# Patient Record
Sex: Male | Born: 1975 | Race: White | Hispanic: No | Marital: Married | Smoking: Former smoker
Health system: Southern US, Community
[De-identification: ages and names within clinical notes are randomized; demographics above are authoritative.]

## PROBLEM LIST (undated history)

## (undated) DIAGNOSIS — G4733 Obstructive sleep apnea (adult) (pediatric): Secondary | ICD-10-CM

## (undated) DIAGNOSIS — Z9989 Dependence on other enabling machines and devices: Secondary | ICD-10-CM

## (undated) DIAGNOSIS — E785 Hyperlipidemia, unspecified: Secondary | ICD-10-CM

## (undated) DIAGNOSIS — T8859XA Other complications of anesthesia, initial encounter: Secondary | ICD-10-CM

## (undated) DIAGNOSIS — T4145XA Adverse effect of unspecified anesthetic, initial encounter: Secondary | ICD-10-CM

## (undated) DIAGNOSIS — I10 Essential (primary) hypertension: Secondary | ICD-10-CM

## (undated) HISTORY — PX: HERNIA REPAIR: SHX51

## (undated) HISTORY — PX: LIPOMA EXCISION: SHX5283

## (undated) HISTORY — DX: Essential (primary) hypertension: I10

## (undated) HISTORY — PX: APPENDECTOMY: SHX54

## (undated) HISTORY — PX: ANTERIOR CRUCIATE LIGAMENT REPAIR: SHX115

## (undated) HISTORY — DX: Obstructive sleep apnea (adult) (pediatric): G47.33

## (undated) HISTORY — DX: Obstructive sleep apnea (adult) (pediatric): Z99.89

## (undated) HISTORY — DX: Hyperlipidemia, unspecified: E78.5

---

## 1998-07-09 ENCOUNTER — Encounter: Payer: Self-pay | Admitting: Emergency Medicine

## 1998-07-09 ENCOUNTER — Emergency Department (HOSPITAL_COMMUNITY): Admission: EM | Admit: 1998-07-09 | Discharge: 1998-07-09 | Payer: Self-pay | Admitting: Emergency Medicine

## 1998-07-14 ENCOUNTER — Ambulatory Visit (HOSPITAL_COMMUNITY): Admission: RE | Admit: 1998-07-14 | Discharge: 1998-07-14 | Payer: Self-pay | Admitting: Orthopedic Surgery

## 2000-04-29 ENCOUNTER — Ambulatory Visit (HOSPITAL_COMMUNITY): Admission: RE | Admit: 2000-04-29 | Discharge: 2000-04-29 | Payer: Self-pay | Admitting: Specialist

## 2000-04-29 ENCOUNTER — Encounter: Payer: Self-pay | Admitting: Specialist

## 2000-07-03 ENCOUNTER — Encounter: Payer: Self-pay | Admitting: Specialist

## 2000-07-03 ENCOUNTER — Ambulatory Visit (HOSPITAL_COMMUNITY): Admission: RE | Admit: 2000-07-03 | Discharge: 2000-07-03 | Payer: Self-pay | Admitting: Specialist

## 2002-12-29 ENCOUNTER — Encounter: Admission: RE | Admit: 2002-12-29 | Discharge: 2002-12-29 | Payer: Self-pay | Admitting: Geriatric Medicine

## 2002-12-29 ENCOUNTER — Encounter: Payer: Self-pay | Admitting: Geriatric Medicine

## 2003-03-08 ENCOUNTER — Emergency Department (HOSPITAL_COMMUNITY): Admission: EM | Admit: 2003-03-08 | Discharge: 2003-03-09 | Payer: Self-pay | Admitting: Emergency Medicine

## 2003-03-09 ENCOUNTER — Encounter: Payer: Self-pay | Admitting: Internal Medicine

## 2003-03-09 ENCOUNTER — Encounter: Admission: RE | Admit: 2003-03-09 | Discharge: 2003-03-09 | Payer: Self-pay | Admitting: Internal Medicine

## 2004-05-24 ENCOUNTER — Ambulatory Visit (HOSPITAL_BASED_OUTPATIENT_CLINIC_OR_DEPARTMENT_OTHER): Admission: RE | Admit: 2004-05-24 | Discharge: 2004-05-24 | Payer: Self-pay | Admitting: Surgery

## 2009-11-29 ENCOUNTER — Encounter: Admission: RE | Admit: 2009-11-29 | Discharge: 2009-11-29 | Payer: Self-pay | Admitting: Internal Medicine

## 2010-05-22 ENCOUNTER — Encounter: Admission: RE | Admit: 2010-05-22 | Discharge: 2010-05-22 | Payer: Self-pay | Admitting: Internal Medicine

## 2010-05-22 ENCOUNTER — Ambulatory Visit (HOSPITAL_COMMUNITY): Admission: RE | Admit: 2010-05-22 | Discharge: 2010-05-23 | Payer: Self-pay | Admitting: Surgery

## 2010-05-22 ENCOUNTER — Encounter (INDEPENDENT_AMBULATORY_CARE_PROVIDER_SITE_OTHER): Payer: Self-pay | Admitting: Surgery

## 2010-11-08 ENCOUNTER — Ambulatory Visit (HOSPITAL_COMMUNITY)
Admission: RE | Admit: 2010-11-08 | Discharge: 2010-11-08 | Disposition: A | Discharge status: Home / Self Care | Payer: BC Managed Care – PPO | Source: Ambulatory Visit | Attending: Gastroenterology | Admitting: Gastroenterology

## 2010-11-08 DIAGNOSIS — Z9089 Acquired absence of other organs: Secondary | ICD-10-CM | POA: Insufficient documentation

## 2010-11-08 DIAGNOSIS — R0681 Apnea, not elsewhere classified: Secondary | ICD-10-CM | POA: Insufficient documentation

## 2010-11-08 DIAGNOSIS — K5730 Diverticulosis of large intestine without perforation or abscess without bleeding: Secondary | ICD-10-CM | POA: Insufficient documentation

## 2010-11-08 DIAGNOSIS — Z800 Family history of malignant neoplasm of digestive organs: Secondary | ICD-10-CM | POA: Insufficient documentation

## 2010-11-08 DIAGNOSIS — K589 Irritable bowel syndrome without diarrhea: Secondary | ICD-10-CM | POA: Insufficient documentation

## 2010-11-20 NOTE — Op Note (Signed)
  Norman Hansen             ACCOUNT NO.:  [id number]  MEDICAL RECORD NO.:  [id number]           PATIENT TYPE:  O  LOCATION:  [facility]                         FACILITY:  [facility]  PHYSICIAN:  Norman Hansen, M.D.   DATE OF BIRTH:  1976-05-14  DATE OF PROCEDURE:  11/08/2010 DATE OF DISCHARGE:                              OPERATIVE REPORT   PROCEDURE:  Colonoscopy.  REFERRING PHYSICIAN:  Thora Lance, M.D.  HISTORY:  [name] is a 36 year old male born on 07-01-1976.  In March 2011, the patient was diagnosed with descending colonic diverticulitis.  In August 2011, he underwent an appendectomy to treat acute appendicitis.  The patient is scheduled to undergo a diagnostic colonoscopy following his bout of acute diverticulitis.  He has no symptoms to suggest inflammatory bowel disease.  His uncle was diagnosed with colon cancer.  He has no first-degree relatives with colon cancer.  ENDOSCOPIST:  Norman Hansen, M.D.  PREMEDICATIONS: 1. Fentanyl 100 mcg. 2. Versed 5 mg.  DESCRIPTION OF PROCEDURE:  After obtaining informed consent, the patient was placed in the left lateral decubitus position.  I administered intravenous fentanyl and intravenous Versed to achieve conscious sedation for the procedure.  The patient's blood pressure, oxygen saturation and cardiac rhythm were monitored throughout the procedure and documented in the medical record.  Anal inspection and digital rectal exam were normal.  The Pentax pediatric colonoscope was introduced into the rectum and easily advanced to the cecum.  A normal-appearing ileocecal valve and appendiceal orifice were identified.  Colonic preparation for the exam today was good.  Rectum normal.  Retroflex view of the distal rectum normal.  Sigmoid colon and descending colon.  Scattered small diverticula without signs of diverticulitis, diverticular stricture formation, or inflammatory bowel disease.  Splenic  flexure normal.  Transverse colon.  A small isolated diverticulum was present in the mid transverse colon.  Hepatic flexure normal.  Descending colon.  A few small diverticula are noted in the ascending colon.  Cecum and ileocecal valve normal.  ASSESSMENT: 1. Colonic diverticulosis without signs of diverticulitis,     diverticular stricture formation, inflammatory bowel disease, or     colorectal neoplasia. 2. Normal proctocolonoscopy to the cecum.  RECOMMENDATIONS:  Schedule screening colonoscopy at age 25.          ______________________________ Norman Hansen, M.D.     MJ/MEDQ  D:  11/08/2010  T:  11/08/2010  Job:  [id number]  cc:   Thora Lance, M.D. Fax: [phone]  Electronically Signed by Norman Hansen M.D. on 11/20/2010 02:37:52 PM

## 2011-02-22 NOTE — Op Note (Signed)
NAME:  Norman Hansen, Norman Hansen                       ACCOUNT NO.:  [id number]   MEDICAL RECORD NO.:  [id number]                   PATIENT TYPE:  AMB   LOCATION:  [facility]                                  FACILITY:  [facility]   PHYSICIAN:  Abigail Miyamoto, M.D.              DATE OF BIRTH:  [birth year]   DATE OF PROCEDURE:  05/24/2004  DATE OF DISCHARGE:                                 OPERATIVE REPORT   PREOPERATIVE DIAGNOSIS:  Umbilical hernia.   POSTOPERATIVE DIAGNOSIS:  Umbilical hernia.   PROCEDURE:  Umbilical hernia repair.   SURGEON:  Dr. Riley Lam A. Magnus Ivan.   ANESTHESIA:  1. General.  2. Marcaine 0.5% with epinephrine.   ESTIMATED BLOOD LOSS:  Minimal.   PROCEDURE IN DETAIL:  The patient was brought to the operating room,  identified as Eulis Manly.  He was placed supine on the operating room  table and general anesthesia was induced.  His abdomen was then prepped and  draped in the usual sterile fashion.  The skin around the umbilicus was then  anesthetized with 0.25% Marcaine.  A small transverse incision was made just  below the umbilicus.  The incision was carried down to the hernia sac which  was easily identified and separated from the umbilical skin.  The contents  of the sac were then reduced back into the peritoneal cavity.  The fascial  defect was then closed with three separate #1 Ethibond sutures in a figure-  of-eight fashion.  Excellent reapproximation of the fascia appeared to be  achieved and the actual fascial defect was less than 1 cm in size.  At this  point, the fascia and skin were further anesthetized with more of the  Marcaine.  The subcutaneous layer was then closed with interrupted 3-0  Vicryl sutures, the skin was closed with running 4-0 Monocryl.  Steri-  Strips, gauze and tape were then applied.  The patient tolerated the  procedure well.  All sponge, needle and instrument counts were correct at  the end of the procedure.  The patient was then extubated  in the operating  room and taken in stable condition to the recovery room.                                               Abigail Miyamoto, M.D.    DB/MEDQ  D:  05/24/2004  T:  05/24/2004  Job:  [id number]

## 2011-02-27 IMAGING — CT CT ABD-PELV W/O CM
2 of 4 series · 14 of 32 positions shown, 19 images · non-contrast
Comparison: 03/09/2003 (with contrast)

CLINICAL DATA: Left flank pain.  History of diverticulitis

CT ABDOMEN AND PELVIS WITHOUT CONTRAST
TECHNIQUE: Multidetector CT imaging of the abdomen and pelvis was
performed following the standard protocol without intravenous
contrast.

[Series 2: renal stone w/o · axial · non-contrast · 0.76mm/px · z∈[-312,+23]mm · 6 of 95 slices shown, 11 images]
[im 14/95  soft-tissue]
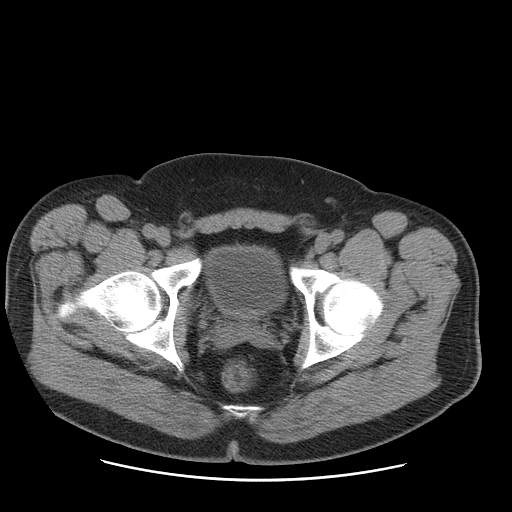
[im 14/95  bone]
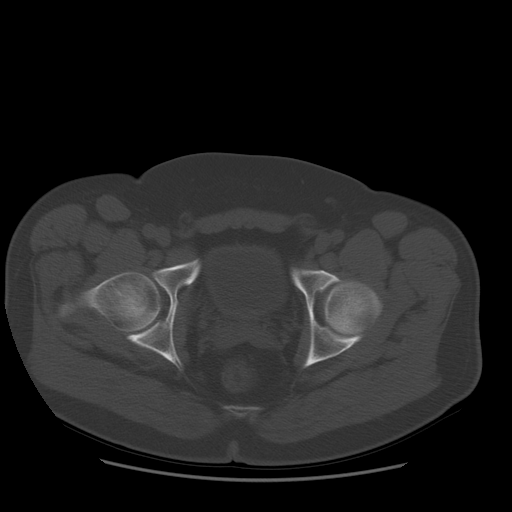
[im 27/95  soft-tissue]
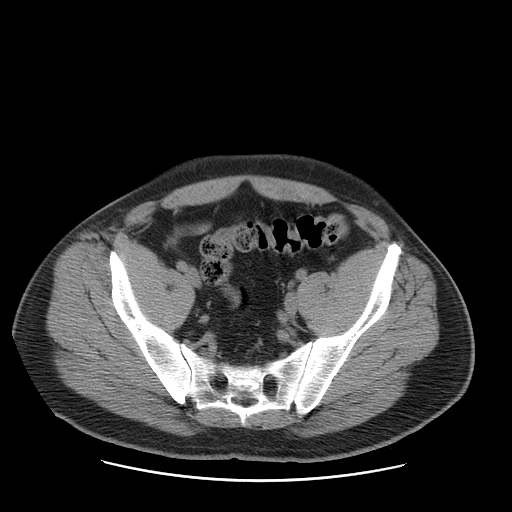
[im 41/95  soft-tissue]
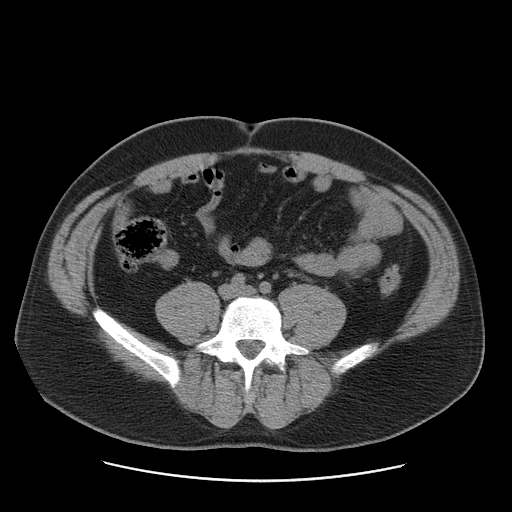
[im 41/95  lung]
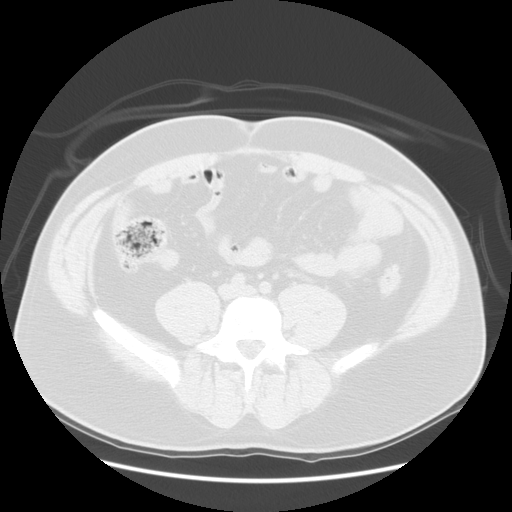
[im 54/95  soft-tissue]
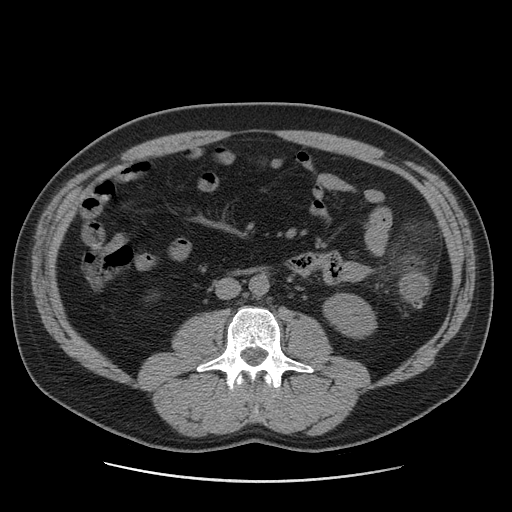
[im 54/95  lung]
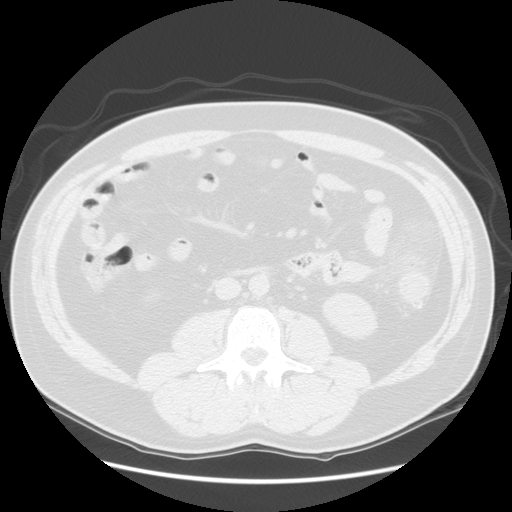
[im 68/95  soft-tissue]
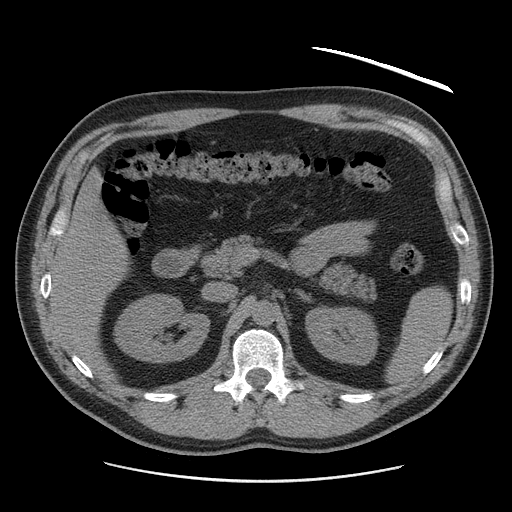
[im 68/95  lung]
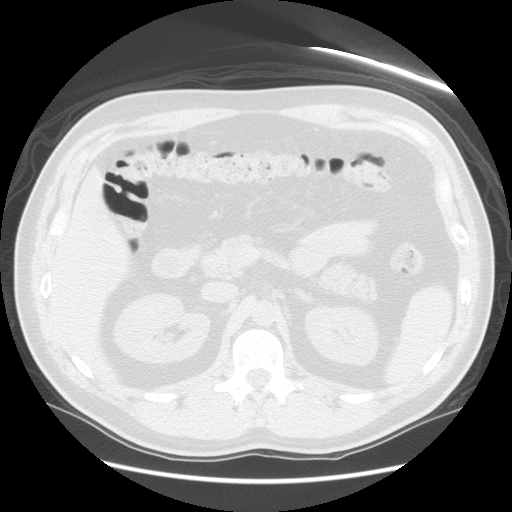
[im 81/95  soft-tissue]
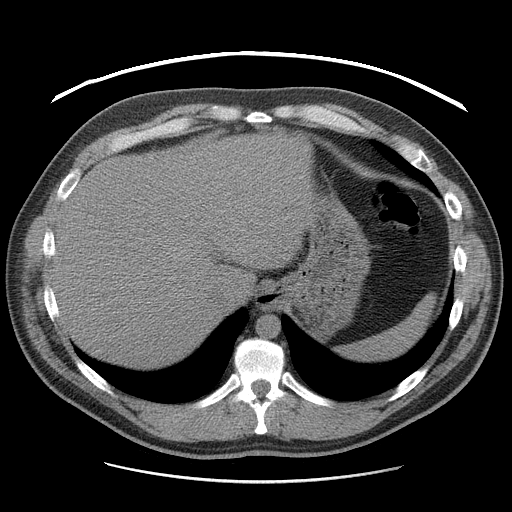
[im 81/95  lung]
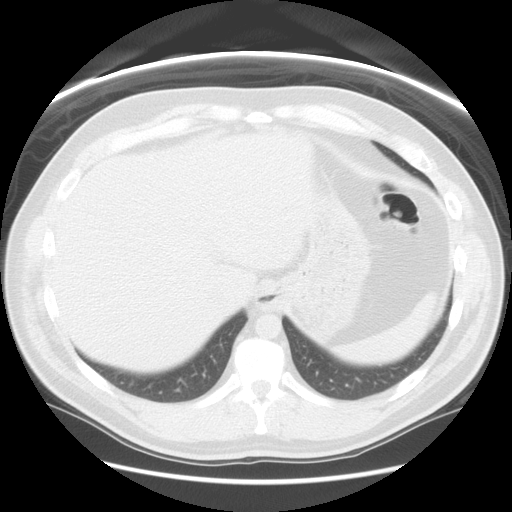

[Series 400: sagittal · sagittal · 0.98mm/px · 8 of 151 slices shown]
[im 14/151  soft-tissue]
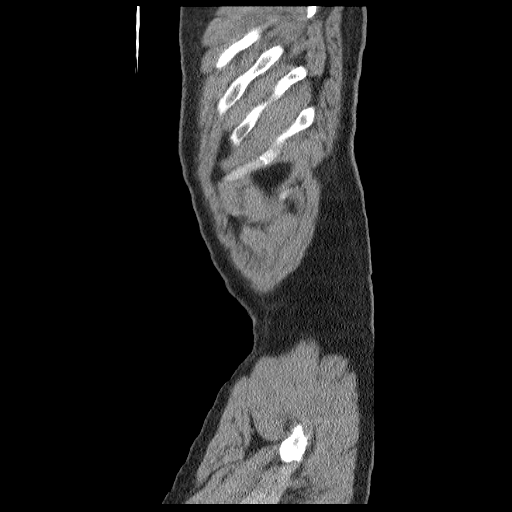
[im 28/151  soft-tissue]
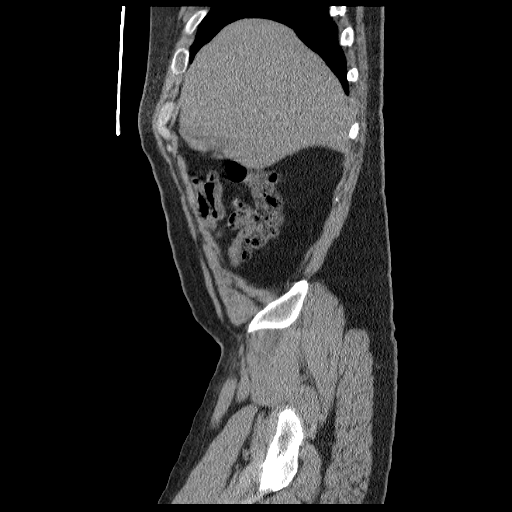
[im 55/151  soft-tissue]
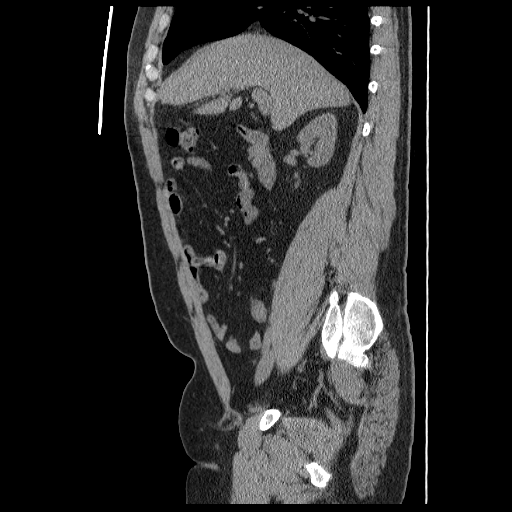
[im 69/151  soft-tissue]
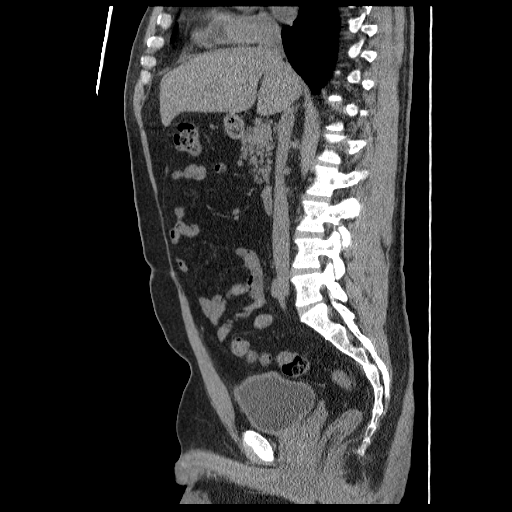
[im 82/151  soft-tissue]
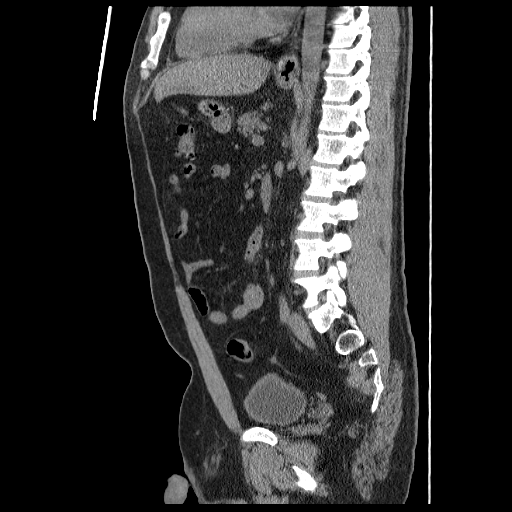
[im 96/151  soft-tissue]
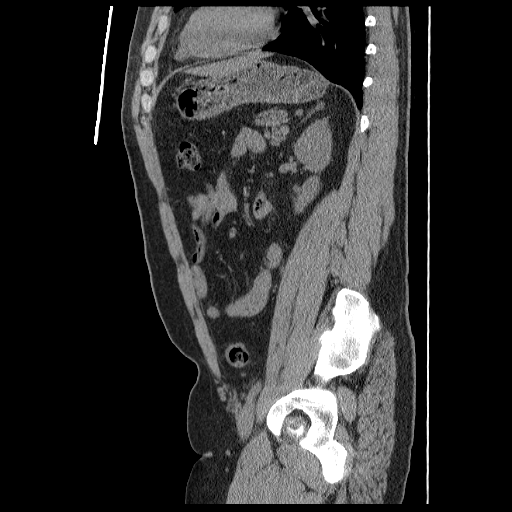
[im 123/151  soft-tissue]
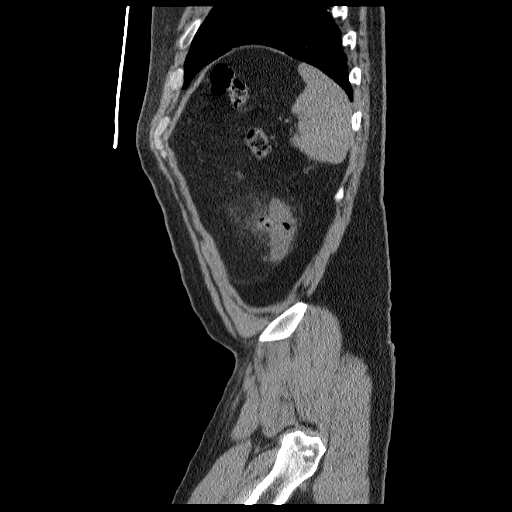
[im 137/151  soft-tissue]
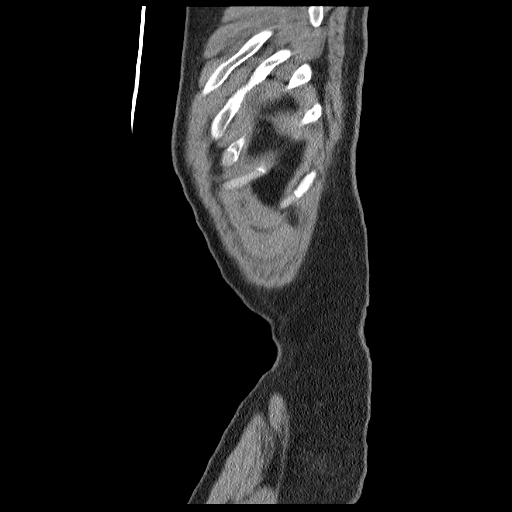

[14 of 32 positions shown; findings below may reference images not displayed]

FINDINGS: The lung bases are clear.  There are no renal calculi or
ureteral calculi.  No hydronephrosis or hydroureter.

Liver, spleen, pancreas, and adrenals normal in the unenhanced
state.

There is thickening of the wall of the mid descending colon with
rather marked stranding of the pericolonic fat.  On images number
43 and 44 of series 2, there is a prominent diverticulum emanating
off the anterior aspect of the descending colon.  On the sagittal
reformatted images (number 123) there is a suspicion of a small
extraluminal gas collection suggesting a walled off perforation.
There is no free air.  There is no abscess or peritoneal fluid. The
appendix is normal.

No adenopathy or ascites.  Osseous structures intact. Pelvic
sidewalls symmetrical.
IMPRESSION: 1.  Findings consistent with acute diverticulitis of the descending
colon with a possible walled off perforation.  See above
discussion.
2.  No urinary tract pathology or other acute findings.

## 2011-05-22 ENCOUNTER — Other Ambulatory Visit: Payer: Self-pay | Admitting: Internal Medicine

## 2011-05-22 DIAGNOSIS — R509 Fever, unspecified: Secondary | ICD-10-CM

## 2011-05-23 ENCOUNTER — Ambulatory Visit
Admission: RE | Admit: 2011-05-23 | Discharge: 2011-05-23 | Disposition: A | Discharge status: Home / Self Care | Payer: BC Managed Care – PPO | Source: Ambulatory Visit | Attending: Internal Medicine | Admitting: Internal Medicine

## 2011-05-23 DIAGNOSIS — R509 Fever, unspecified: Secondary | ICD-10-CM

## 2011-05-23 MED ORDER — IOHEXOL 300 MG/ML  SOLN
100.0000 mL | Freq: Once | INTRAMUSCULAR | Status: AC | PRN
  Administered 2011-05-23: 100 mL via INTRAVENOUS

## 2014-03-08 ENCOUNTER — Telehealth: Payer: Self-pay

## 2014-03-08 NOTE — Telephone Encounter (Signed)
error 

## 2015-08-09 ENCOUNTER — Encounter: Payer: Self-pay | Admitting: Internal Medicine

## 2015-08-09 ENCOUNTER — Ambulatory Visit (INDEPENDENT_AMBULATORY_CARE_PROVIDER_SITE_OTHER): Payer: No Typology Code available for payment source | Admitting: Internal Medicine

## 2015-08-09 VITALS — BP 140/90 | HR 96 | Ht 68.0 in | Wt 229.0 lb

## 2015-08-09 DIAGNOSIS — G4733 Obstructive sleep apnea (adult) (pediatric): Secondary | ICD-10-CM | POA: Diagnosis not present

## 2015-08-09 DIAGNOSIS — I10 Essential (primary) hypertension: Secondary | ICD-10-CM | POA: Insufficient documentation

## 2015-08-09 DIAGNOSIS — R9431 Abnormal electrocardiogram [ECG] [EKG]: Secondary | ICD-10-CM

## 2015-08-09 DIAGNOSIS — E785 Hyperlipidemia, unspecified: Secondary | ICD-10-CM

## 2015-08-09 DIAGNOSIS — R079 Chest pain, unspecified: Secondary | ICD-10-CM

## 2015-08-09 DIAGNOSIS — Z87891 Personal history of nicotine dependence: Secondary | ICD-10-CM

## 2015-08-09 NOTE — Patient Instructions (Signed)
Your physician has requested that you have an exercise stress myoview. For further information please visit [email or web address]. Please follow instruction sheet, as given.  Your physician recommends that you schedule a follow-up appointment with Dr. Hilty after your stress test.   

## 2015-08-09 NOTE — Progress Notes (Signed)
OFFICE NOTE  Chief Complaint:  Chest pain, high blood pressure  Primary Care Physician: Lillia MountainGRIFFIN,JOHN JOSEPH, MD  HPI:  Norman Hansen is a pleasant 39 year old male whose mother is a patient of mine. Recently he's been struggling with elevated blood pressures. He's also had some chest discomfort. He reports this is in the left central chest and feels like a squeezing pressure. Occasionally it will be a sharp pain. Sometimes worse with exertion and relieved by rest. Other times it occurs at rest. He's been under significant stress at work and related it to that. He's also had pain in his left shoulder and arm but he feels that this is more likely to be orthopedic. Family history significant for hypertension in both parents. He does have obstructive sleep apnea and is compliant on CPAP. He has abnormal a cholesterol and was recommended to go on to cholesterol medication but has not started taking that due to problems with cramping in his legs. He was concerned about the additional side effects of a statin. EKG in the office today is abnormal indicating nonspecific ST and T-wave changes mostly anterolaterally.  PMHx:  Past Medical History  Diagnosis Date  . OSA on CPAP   . Hyperlipidemia   . Hypertension     History reviewed. No pertinent past surgical history.  FAMHx:  Family History  Problem Relation Age of Onset  . Hypertension Mother   . Hypertension Father     SOCHx:   reports that he has quit smoking. His smoking use included Cigarettes. His smokeless tobacco use includes Chew. He reports that he does not drink alcohol or use illicit drugs.  ALLERGIES:  No Known Allergies  ROS: A comprehensive review of systems was negative except for: Cardiovascular: positive for chest pain  HOME MEDS: Current Outpatient Prescriptions  Medication Sig Dispense Refill  . aspirin 81 MG tablet Take 81 mg by mouth daily.    Marland Kitchen. atorvastatin (LIPITOR) 10 MG tablet Take 10 mg by mouth  daily.  12  . metoprolol succinate (TOPROL-XL) 25 MG 24 hr tablet Take 25 mg by mouth daily.  6   No current facility-administered medications for this visit.    LABS/IMAGING: No results found for this or any previous visit (from the past 48 hour(s)). No results found.  WEIGHTS: Wt Readings from Last 3 Encounters:  08/09/15 229 lb (103.874 kg)    VITALS: BP 140/90 mmHg  Pulse 96  Ht 5\' 8"  (1.727 m)  Wt 229 lb (103.874 kg)  BMI 34.83 kg/m2  EXAM: General appearance: alert and no distress Neck: no carotid bruit and no JVD Lungs: clear to auscultation bilaterally Heart: regular rate and rhythm, S1, S2 normal, no murmur, click, rub or gallop Abdomen: soft, non-tender; bowel sounds normal; no masses,  no organomegaly Extremities: extremities normal, atraumatic, no cyanosis or edema Pulses: 2+ and symmetric Skin: Skin color, texture, turgor normal. No rashes or lesions Neurologic: Grossly normal Psych: Mildly anxious  EKG: Normal sinus rhythm at 96, nonspecific ST and T-wave changes, less than 1 mm ST segment depression anterolaterally  ASSESSMENT: 1. Chest pain with ischemic EKG changes 2. Dyslipidemia 3. Obesity 4. Obstructive sleep apnea on CPAP 5. Uncontrolled hypertension  PLAN: 1.   Mr. Elige RadonBradley is expressing some chest pain and has some possible ischemic EKG changes. He is cardiac risk factors including being a former smoker, obstructive sleep apnea, elevated cholesterol and uncontrolled hypertension which is been worse recently. He was started on a beta blocker which seems  to have helped his pressures. I would recommend an exercise nuclear stress test to evaluate for possible ischemia. If this is abnormal he may need cardiac catheterization. I've encouraged him to continue to work on weight loss but for the time being not started any new or vigorous exercise programs. If he were to have any worsening chest pain he should go to the emergency room to have that evaluated.  It's okay for him to hold off on starting his cholesterol medication until we understand the degree of coronary artery disease or not. I did not want to add any complicating side effects to the situation at this time.  Plan to see him back in a few weeks to discuss the stress test results. Thanks again for the kind referral.  Chrystie Nose, MD, [facility] Attending Cardiologist [facility]  Chrystie Nose 08/09/2015, 12:46 PM

## 2015-08-11 ENCOUNTER — Telehealth (HOSPITAL_COMMUNITY): Payer: Self-pay

## 2015-08-11 NOTE — Telephone Encounter (Signed)
Encounter complete. 

## 2015-08-15 ENCOUNTER — Ambulatory Visit (HOSPITAL_COMMUNITY)
Admission: RE | Admit: 2015-08-15 | Discharge: 2015-08-15 | Disposition: A | Discharge status: Home / Self Care | Payer: No Typology Code available for payment source | Source: Ambulatory Visit | Attending: Cardiovascular Disease | Admitting: Cardiovascular Disease

## 2015-08-15 DIAGNOSIS — R42 Dizziness and giddiness: Secondary | ICD-10-CM | POA: Diagnosis not present

## 2015-08-15 DIAGNOSIS — Z87891 Personal history of nicotine dependence: Secondary | ICD-10-CM | POA: Insufficient documentation

## 2015-08-15 DIAGNOSIS — G4733 Obstructive sleep apnea (adult) (pediatric): Secondary | ICD-10-CM | POA: Insufficient documentation

## 2015-08-15 DIAGNOSIS — Z8249 Family history of ischemic heart disease and other diseases of the circulatory system: Secondary | ICD-10-CM | POA: Diagnosis not present

## 2015-08-15 DIAGNOSIS — R5383 Other fatigue: Secondary | ICD-10-CM | POA: Diagnosis not present

## 2015-08-15 DIAGNOSIS — R079 Chest pain, unspecified: Secondary | ICD-10-CM | POA: Diagnosis not present

## 2015-08-15 DIAGNOSIS — E785 Hyperlipidemia, unspecified: Secondary | ICD-10-CM

## 2015-08-15 DIAGNOSIS — R9431 Abnormal electrocardiogram [ECG] [EKG]: Secondary | ICD-10-CM

## 2015-08-15 DIAGNOSIS — I10 Essential (primary) hypertension: Secondary | ICD-10-CM | POA: Insufficient documentation

## 2015-08-15 LAB — MYOCARDIAL PERFUSION IMAGING
CHL CUP MPHR: 179 {beats}/min
CHL CUP NUCLEAR SDS: 0
CHL CUP NUCLEAR SSS: 0
CHL RATE OF PERCEIVED EXERTION: 17
CSEPEW: 10.1 METS
CSEPHR: 95 %
CSEPPHR: 171 {beats}/min
Exercise duration (min): 8 min
Exercise duration (sec): 1 s
LVDIAVOL: 82 mL
LVSYSVOL: 39 mL
Rest HR: 95 {beats}/min
SRS: 0
TID: 1.18

## 2015-08-15 MED ORDER — TECHNETIUM TC 99M SESTAMIBI GENERIC - CARDIOLITE
10.2000 | Freq: Once | INTRAVENOUS | Status: AC | PRN
  Administered 2015-08-15: 10.2 via INTRAVENOUS

## 2015-08-15 MED ORDER — TECHNETIUM TC 99M SESTAMIBI GENERIC - CARDIOLITE
31.2000 | Freq: Once | INTRAVENOUS | Status: AC | PRN
  Administered 2015-08-15: 31.2 via INTRAVENOUS

## 2015-08-22 ENCOUNTER — Encounter: Payer: Self-pay | Admitting: Internal Medicine

## 2015-08-22 ENCOUNTER — Ambulatory Visit (INDEPENDENT_AMBULATORY_CARE_PROVIDER_SITE_OTHER): Payer: No Typology Code available for payment source | Admitting: Internal Medicine

## 2015-08-22 VITALS — BP 130/88 | HR 80 | Ht 68.0 in | Wt 227.7 lb

## 2015-08-22 DIAGNOSIS — E785 Hyperlipidemia, unspecified: Secondary | ICD-10-CM | POA: Diagnosis not present

## 2015-08-22 DIAGNOSIS — I209 Angina pectoris, unspecified: Secondary | ICD-10-CM | POA: Diagnosis not present

## 2015-08-22 DIAGNOSIS — R9431 Abnormal electrocardiogram [ECG] [EKG]: Secondary | ICD-10-CM | POA: Diagnosis not present

## 2015-08-22 DIAGNOSIS — Z87891 Personal history of nicotine dependence: Secondary | ICD-10-CM

## 2015-08-22 DIAGNOSIS — G4733 Obstructive sleep apnea (adult) (pediatric): Secondary | ICD-10-CM

## 2015-08-22 DIAGNOSIS — I10 Essential (primary) hypertension: Secondary | ICD-10-CM

## 2015-08-22 NOTE — Progress Notes (Signed)
OFFICE NOTE  Chief Complaint:  Follow up stress test  Primary Care Physician: Lillia MountainGRIFFIN,JOHN JOSEPH, MD  HPI:  Norman Hansen is a pleasant 39 year old male whose mother is a patient of mine. Recently he's been struggling with elevated blood pressures. He's also had some chest discomfort. He reports this is in the left central chest and feels like a squeezing pressure. Occasionally it will be a sharp pain. Sometimes worse with exertion and relieved by rest. Other times it occurs at rest. He's been under significant stress at work and related it to that. He's also had pain in his left shoulder and arm but he feels that this is more likely to be orthopedic. Family history significant for hypertension in both parents. He does have obstructive sleep apnea and is compliant on CPAP. He has abnormal a cholesterol and was recommended to go on to cholesterol medication but has not started taking that due to problems with cramping in his legs. He was concerned about the additional side effects of a statin. EKG in the office today is abnormal indicating nonspecific ST and T-wave changes mostly anterolaterally.  Norman Hansen returns today for follow-up of his stress test. As demonstrated an adequate amount of exercise and no chest pain with exercise. EF was 52% with no signs of ischemia. Since then he's had no further chest pain episodes. He reported he went hunting this past weekend and moved a lot of equipment and carried some heavy coolers and denied any shortness of breath or worsening chest pain with this. He has not yet started his Lipitor but wanted to have a recheck of his cholesterol profile as his work on his diet and is working on some weight loss. It does seem that his weight is down about 2 pounds since his last office visit. He reports being compliant with CPAP for sleep apnea.  PMHx:  Past Medical History  Diagnosis Date  . OSA on CPAP     epworth sleepiness score = 4 (08/09/2015)  .  Hyperlipidemia   . Hypertension     No past surgical history on file.  FAMHx:  Family History  Problem Relation Age of Onset  . Hypertension Father   . Hypertension Mother   . Rheum arthritis Mother     SOCHx:   reports that he has quit smoking. His smoking use included Cigarettes. His smokeless tobacco use includes Chew. He reports that he does not drink alcohol or use illicit drugs.  ALLERGIES:  No Known Allergies  ROS: A comprehensive review of systems was negative.  HOME MEDS: Current Outpatient Prescriptions  Medication Sig Dispense Refill  . aspirin 81 MG tablet Take 81 mg by mouth daily.    Marland Kitchen. atorvastatin (LIPITOR) 10 MG tablet Take 10 mg by mouth daily.  12  . metoprolol succinate (TOPROL-XL) 25 MG 24 hr tablet Take 25 mg by mouth daily.  6   No current facility-administered medications for this visit.    LABS/IMAGING: No results found for this or any previous visit (from the past 48 hour(s)). No results found.  WEIGHTS: Wt Readings from Last 3 Encounters:  08/22/15 227 lb 11.2 oz (103.284 kg)  08/15/15 229 lb (103.874 kg)  08/09/15 229 lb (103.874 kg)    VITALS: BP 130/88 mmHg  Pulse 80  Ht 5\' 8"  (1.727 m)  Wt 227 lb 11.2 oz (103.284 kg)  BMI 34.63 kg/m2  EXAM: Deferred  EKG: Deferred  ASSESSMENT: 1. Chest pain with ischemic EKG changes - low  risk nuclear stress test, EF 52% 2. Dyslipidemia 3. Obesity 4. Obstructive sleep apnea on CPAP 5. Hypertension-controlled  PLAN: 1.   Norman Hansen had a low risk nuclear stress test and has had improvement in his chest pain symptoms. He's now doing more exercise and working on weight loss. He's made dietary changes. I think blood pressure control is helped him a good bit. He still most likely will need to be on cholesterol medication. He wants to have a recheck of his cholesterol and plan to see his primary care provider for a full physical next week. Goal may be 50% reduction in cholesterol based on risk  factor of hypertension and family history.  Thanks again for the kind referral. I'm happy to see him back on an as-needed basis.  Chrystie Nose, MD, [facility] Attending Cardiologist [facility]  Lisette Abu Sharilynn Cassity 08/22/2015, 9:15 AM

## 2015-08-22 NOTE — Patient Instructions (Signed)
Your physician recommends that you schedule a follow-up appointment as needed  

## 2016-04-09 ENCOUNTER — Emergency Department (HOSPITAL_COMMUNITY)
Admission: EM | Admit: 2016-04-09 | Discharge: 2016-04-09 | Disposition: A | Discharge status: Home / Self Care | Payer: No Typology Code available for payment source | Attending: Emergency Medicine | Admitting: Emergency Medicine

## 2016-04-09 ENCOUNTER — Encounter (HOSPITAL_COMMUNITY): Payer: Self-pay

## 2016-04-09 ENCOUNTER — Emergency Department (HOSPITAL_COMMUNITY): Payer: No Typology Code available for payment source

## 2016-04-09 DIAGNOSIS — Y92828 Other wilderness area as the place of occurrence of the external cause: Secondary | ICD-10-CM | POA: Insufficient documentation

## 2016-04-09 DIAGNOSIS — Z87891 Personal history of nicotine dependence: Secondary | ICD-10-CM | POA: Insufficient documentation

## 2016-04-09 DIAGNOSIS — S52022A Displaced fracture of olecranon process without intraarticular extension of left ulna, initial encounter for closed fracture: Secondary | ICD-10-CM | POA: Insufficient documentation

## 2016-04-09 DIAGNOSIS — S42402A Unspecified fracture of lower end of left humerus, initial encounter for closed fracture: Secondary | ICD-10-CM | POA: Insufficient documentation

## 2016-04-09 DIAGNOSIS — I10 Essential (primary) hypertension: Secondary | ICD-10-CM | POA: Diagnosis not present

## 2016-04-09 DIAGNOSIS — S53104A Unspecified dislocation of right ulnohumeral joint, initial encounter: Secondary | ICD-10-CM | POA: Diagnosis present

## 2016-04-09 DIAGNOSIS — Y999 Unspecified external cause status: Secondary | ICD-10-CM | POA: Diagnosis not present

## 2016-04-09 DIAGNOSIS — Y9355 Activity, bike riding: Secondary | ICD-10-CM | POA: Insufficient documentation

## 2016-04-09 MED ORDER — OXYCODONE-ACETAMINOPHEN 5-325 MG PO TABS: 1.0000 | ORAL_TABLET | ORAL | Status: DC | PRN

## 2016-04-09 MED ORDER — HYDROMORPHONE HCL 1 MG/ML IJ SOLN
1.0000 mg | INTRAMUSCULAR | Status: DC | PRN
  Administered 2016-04-09: 1 mg via INTRAVENOUS
  Filled 2016-04-09: qty 1

## 2016-04-09 MED ORDER — HYDROMORPHONE HCL 1 MG/ML IJ SOLN
1.0000 mg | Freq: Once | INTRAMUSCULAR | Status: AC
  Administered 2016-04-09: 1 mg via INTRAVENOUS
  Filled 2016-04-09: qty 1
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
## 2016-04-09 NOTE — ED Notes (Signed)
Pt verbalized understanding splint care, prescriptions, all d/c instructions and has no further questions. Pt stable and NAD. Pt d/c home with family driving. Pt removed all belongings from room.

## 2016-04-09 NOTE — Progress Notes (Signed)
Orthopedic Tech Progress Note Patient Details:  Norman BackerMatthew A Hansen August 21, 1976 [id number]  Ortho Devices Type of Ortho Device: Ace wrap, Post (long arm) splint Ortho Device/Splint Location: LUE Ortho Device/Splint Interventions: Ordered, Application   Jennye MoccasinHughes, Norman Hansen Craig 04/09/2016, 10:00 PM

## 2016-04-09 NOTE — ED Notes (Signed)
Dr Cyndie ChimeNguyen in room. Pt's left arm placed in sling by this RN

## 2016-04-09 NOTE — Discharge Instructions (Signed)
Elbow Fracture A fracture is a break in one of the bones.When fractures are not displaced or separated, they may be treated with only a sling or splint.  Your fracture is displaced and will need surgery.  You need to follow up with the orthopedic surgeon outpatient. Keep your splint in place and do not remove it unless you develop loss of sensation or significant swelling of your fingers/hand. DIAGNOSIS  The diagnosis (learning what is wrong) of a fractured elbow is made by x-ray. These may be required before and after the elbow is put into a splint or cast. X-rays are taken after to make sure the bone pieces have not moved. HOME CARE INSTRUCTIONS   Only take over-the-counter or prescription medicines for pain, discomfort, or fever as directed by your caregiver.  If you have a splint held on with an elastic wrap and your hand or fingers become numb or cold and blue, loosen the wrap and reapply more loosely. See your caregiver if there is no relief.  You may use ice for twenty minutes, four times per day, for the first two to three days.  Use your elbow as directed.  See your caregiver as directed. It is very important to keep all follow-up referrals and appointments in order to avoid any long-term problems with your elbow including chronic pain or stiffness. SEEK IMMEDIATE MEDICAL CARE IF:   There is swelling or increasing pain in elbow.  You begin to lose feeling or experience numbness or tingling in your hand or fingers.  You develop swelling of the hand and fingers.  You get a cold or blue hand or fingers on affected side. MAKE SURE YOU:   Understand these instructions.  Will watch your condition.  Will get help right away if you are not doing well or get worse.   This information is not intended to replace advice given to you by your health care provider. Make sure you discuss any questions you have with your health care provider.   Document Released: 09/17/2001 Document  Revised: 12/16/2011 Document Reviewed: 08/08/2009 Elsevier Interactive Patient Education [employer]2016 [employer]

## 2016-04-09 NOTE — ED Notes (Signed)
This RN in room with Ortho tech for cast placement.

## 2016-04-10 ENCOUNTER — Other Ambulatory Visit: Payer: Self-pay | Admitting: Orthopedic Surgery

## 2016-04-10 DIAGNOSIS — S42402A Unspecified fracture of lower end of left humerus, initial encounter for closed fracture: Secondary | ICD-10-CM
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
## 2016-04-11 ENCOUNTER — Ambulatory Visit
Admission: RE | Admit: 2016-04-11 | Discharge: 2016-04-11 | Disposition: A | Discharge status: Home / Self Care | Payer: No Typology Code available for payment source | Source: Ambulatory Visit | Attending: Orthopedic Surgery | Admitting: Orthopedic Surgery

## 2016-04-11 ENCOUNTER — Other Ambulatory Visit: Payer: No Typology Code available for payment source

## 2016-04-11 DIAGNOSIS — S42402A Unspecified fracture of lower end of left humerus, initial encounter for closed fracture: Secondary | ICD-10-CM

## 2016-04-12 ENCOUNTER — Other Ambulatory Visit: Payer: Self-pay | Admitting: Orthopedic Surgery

## 2016-04-12 ENCOUNTER — Encounter (HOSPITAL_BASED_OUTPATIENT_CLINIC_OR_DEPARTMENT_OTHER): Payer: Self-pay | Admitting: *Deleted
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
## 2016-04-16 ENCOUNTER — Ambulatory Visit (HOSPITAL_BASED_OUTPATIENT_CLINIC_OR_DEPARTMENT_OTHER)
Admission: RE | Admit: 2016-04-16 | Discharge: 2016-04-16 | Disposition: A | Discharge status: Home / Self Care | Payer: No Typology Code available for payment source | Source: Ambulatory Visit | Attending: Orthopedic Surgery | Admitting: Orthopedic Surgery

## 2016-04-16 ENCOUNTER — Encounter (HOSPITAL_BASED_OUTPATIENT_CLINIC_OR_DEPARTMENT_OTHER)
Admission: RE | Disposition: A | Discharge status: Home / Self Care | Payer: Self-pay | Source: Ambulatory Visit | Attending: Orthopedic Surgery

## 2016-04-16 ENCOUNTER — Encounter (HOSPITAL_BASED_OUTPATIENT_CLINIC_OR_DEPARTMENT_OTHER): Payer: Self-pay | Admitting: *Deleted

## 2016-04-16 ENCOUNTER — Ambulatory Visit (HOSPITAL_COMMUNITY): Payer: No Typology Code available for payment source

## 2016-04-16 ENCOUNTER — Ambulatory Visit (HOSPITAL_BASED_OUTPATIENT_CLINIC_OR_DEPARTMENT_OTHER): Payer: No Typology Code available for payment source | Admitting: Anesthesiology

## 2016-04-16 DIAGNOSIS — S53442A Ulnar collateral ligament sprain of left elbow, initial encounter: Secondary | ICD-10-CM | POA: Diagnosis not present

## 2016-04-16 DIAGNOSIS — S52042A Displaced fracture of coronoid process of left ulna, initial encounter for closed fracture: Secondary | ICD-10-CM | POA: Diagnosis not present

## 2016-04-16 DIAGNOSIS — G4733 Obstructive sleep apnea (adult) (pediatric): Secondary | ICD-10-CM | POA: Insufficient documentation

## 2016-04-16 DIAGNOSIS — Z6832 Body mass index (BMI) 32.0-32.9, adult: Secondary | ICD-10-CM | POA: Insufficient documentation

## 2016-04-16 DIAGNOSIS — E669 Obesity, unspecified: Secondary | ICD-10-CM | POA: Insufficient documentation

## 2016-04-16 DIAGNOSIS — S52122A Displaced fracture of head of left radius, initial encounter for closed fracture: Secondary | ICD-10-CM | POA: Insufficient documentation

## 2016-04-16 DIAGNOSIS — S52022A Displaced fracture of olecranon process without intraarticular extension of left ulna, initial encounter for closed fracture: Secondary | ICD-10-CM | POA: Diagnosis present

## 2016-04-16 DIAGNOSIS — I10 Essential (primary) hypertension: Secondary | ICD-10-CM | POA: Diagnosis not present

## 2016-04-16 DIAGNOSIS — S52572A Other intraarticular fracture of lower end of left radius, initial encounter for closed fracture: Secondary | ICD-10-CM | POA: Insufficient documentation

## 2016-04-16 DIAGNOSIS — Z87891 Personal history of nicotine dependence: Secondary | ICD-10-CM | POA: Insufficient documentation

## 2016-04-16 DIAGNOSIS — Z4789 Encounter for other orthopedic aftercare: Secondary | ICD-10-CM

## 2016-04-16 HISTORY — PX: RADIAL HEAD ARTHROPLASTY: SHX6044

## 2016-04-16 HISTORY — DX: Other complications of anesthesia, initial encounter: T88.59XA

## 2016-04-16 HISTORY — DX: Adverse effect of unspecified anesthetic, initial encounter: T41.45XA

## 2016-04-16 SURGERY — ARTHROPLASTY, RADIUS, HEAD
Anesthesia: Regional | Site: Elbow | Laterality: Left

## 2016-04-16 MED ORDER — DEXAMETHASONE SODIUM PHOSPHATE 10 MG/ML IJ SOLN
INTRAMUSCULAR | Status: DC | PRN
  Administered 2016-04-16: 10 mg via INTRAVENOUS

## 2016-04-16 MED ORDER — MIDAZOLAM HCL 2 MG/2ML IJ SOLN
INTRAMUSCULAR | Status: AC
  Filled 2016-04-16: qty 2

## 2016-04-16 MED ORDER — FENTANYL CITRATE (PF) 100 MCG/2ML IJ SOLN
INTRAMUSCULAR | Status: AC
  Filled 2016-04-16: qty 2

## 2016-04-16 MED ORDER — BUPIVACAINE-EPINEPHRINE (PF) 0.5% -1:200000 IJ SOLN
INTRAMUSCULAR | Status: DC | PRN
  Administered 2016-04-16: 30 mL via PERINEURAL

## 2016-04-16 MED ORDER — LACTATED RINGERS IV SOLN
INTRAVENOUS | Status: DC
  Administered 2016-04-16 (×2): via INTRAVENOUS

## 2016-04-16 MED ORDER — CEFAZOLIN SODIUM-DEXTROSE 2-4 GM/100ML-% IV SOLN
INTRAVENOUS | Status: AC
Start: 2016-04-16 — End: 2016-04-16
  Filled 2016-04-16: qty 100

## 2016-04-16 MED ORDER — PROPOFOL 10 MG/ML IV BOLUS
INTRAVENOUS | Status: DC | PRN
  Administered 2016-04-16: 200 mg via INTRAVENOUS

## 2016-04-16 MED ORDER — FENTANYL CITRATE (PF) 100 MCG/2ML IJ SOLN: 25.0000 ug | INTRAMUSCULAR | Status: DC | PRN

## 2016-04-16 MED ORDER — PROMETHAZINE HCL 25 MG/ML IJ SOLN
6.2500 mg | INTRAMUSCULAR | Status: DC | PRN
  Administered 2016-04-16: 6.25 mg via INTRAVENOUS

## 2016-04-16 MED ORDER — CEFAZOLIN SODIUM-DEXTROSE 2-4 GM/100ML-% IV SOLN
2.0000 g | INTRAVENOUS | Status: AC
  Administered 2016-04-16: 2 g via INTRAVENOUS

## 2016-04-16 MED ORDER — MIDAZOLAM HCL 2 MG/2ML IJ SOLN
1.0000 mg | INTRAMUSCULAR | Status: DC | PRN
  Administered 2016-04-16: 2 mg via INTRAVENOUS
  Administered 2016-04-16: 1 mg via INTRAVENOUS

## 2016-04-16 MED ORDER — LACTATED RINGERS IV SOLN: INTRAVENOUS | Status: DC

## 2016-04-16 MED ORDER — CEFAZOLIN SODIUM-DEXTROSE 2-4 GM/100ML-% IV SOLN
INTRAVENOUS | Status: AC
  Filled 2016-04-16: qty 100

## 2016-04-16 MED ORDER — ONDANSETRON HCL 4 MG/2ML IJ SOLN
INTRAMUSCULAR | Status: DC | PRN
  Administered 2016-04-16: 4 mg via INTRAVENOUS

## 2016-04-16 MED ORDER — GLYCOPYRROLATE 0.2 MG/ML IJ SOLN: 0.2000 mg | Freq: Once | INTRAMUSCULAR | Status: DC | PRN

## 2016-04-16 MED ORDER — CEFAZOLIN SODIUM-DEXTROSE 2-4 GM/100ML-% IV SOLN
2.0000 g | Freq: Once | INTRAVENOUS | Status: AC
  Administered 2016-04-16: 2 g via INTRAVENOUS

## 2016-04-16 MED ORDER — LIDOCAINE HCL (CARDIAC) 20 MG/ML IV SOLN
INTRAVENOUS | Status: DC | PRN
  Administered 2016-04-16: 50 mg via INTRAVENOUS

## 2016-04-16 MED ORDER — SCOPOLAMINE 1 MG/3DAYS TD PT72: 1.0000 | MEDICATED_PATCH | Freq: Once | TRANSDERMAL | Status: DC | PRN

## 2016-04-16 MED ORDER — OXYCODONE-ACETAMINOPHEN 5-325 MG PO TABS
1.0000 | ORAL_TABLET | ORAL | Status: AC | PRN
Start: 2016-04-16 — End: ?

## 2016-04-16 MED ORDER — PROMETHAZINE HCL 25 MG/ML IJ SOLN
INTRAMUSCULAR | Status: AC
  Filled 2016-04-16: qty 1

## 2016-04-16 MED ORDER — FENTANYL CITRATE (PF) 100 MCG/2ML IJ SOLN
50.0000 ug | INTRAMUSCULAR | Status: AC | PRN
  Administered 2016-04-16: 25 ug via INTRAVENOUS
  Administered 2016-04-16 (×2): 50 ug via INTRAVENOUS

## 2016-04-16 SURGICAL SUPPLY — 88 items
ANCHOR JUGGERKNOT W/DRL 2/1.45 (Orthopedic Implant) ×4 IMPLANT
ANCHOR SCREW TI W/SUT 3 (Screw) ×3 IMPLANT
ANCHOR SCREW TI W/SUT 3MM (Screw) ×1 IMPLANT
BIT DRILL 2.5X2.75 QC CALB (BIT) ×4 IMPLANT
BIT DRILL CALIBRATED 2.7 (BIT) ×3 IMPLANT
BIT DRILL CALIBRATED 2.7MM (BIT) ×1
BIT DRILL CANN AO HCS 2.0X65 (BIT) ×3 IMPLANT
BIT DRILL CANN AO HCS 2.0X65MM (BIT) ×1
BLADE SURG 15 STRL LF DISP TIS (BLADE) ×2 IMPLANT
BLADE SURG 15 STRL SS (BLADE) ×2
BNDG COHESIVE 4X5 TAN STRL (GAUZE/BANDAGES/DRESSINGS) ×4 IMPLANT
BNDG COHESIVE 6X5 TAN STRL LF (GAUZE/BANDAGES/DRESSINGS) ×4 IMPLANT
BNDG ESMARK 4X9 LF (GAUZE/BANDAGES/DRESSINGS) ×4 IMPLANT
BNDG GAUZE ELAST 4 BULKY (GAUZE/BANDAGES/DRESSINGS) ×8 IMPLANT
BRUSH SCRUB EZ PLAIN DRY (MISCELLANEOUS) ×4 IMPLANT
CANISTER SUCT 1200ML W/VALVE (MISCELLANEOUS) ×4 IMPLANT
CHLORAPREP W/TINT 26ML (MISCELLANEOUS) ×4 IMPLANT
CORDS BIPOLAR (ELECTRODE) ×4 IMPLANT
COVER BACK TABLE 60X90IN (DRAPES) ×4 IMPLANT
COVER MAYO STAND STRL (DRAPES) ×8 IMPLANT
CUFF TOURN SGL LL 18 NRW (TOURNIQUET CUFF) ×4 IMPLANT
CUFF TOURNIQUET SINGLE 18IN (TOURNIQUET CUFF) IMPLANT
CUFF TOURNIQUET SINGLE 24IN (TOURNIQUET CUFF) IMPLANT
DRAPE C-ARM 42X72 X-RAY (DRAPES) ×4 IMPLANT
DRAPE EXTREMITY T 121X128X90 (DRAPE) IMPLANT
DRAPE SURG 17X23 STRL (DRAPES) IMPLANT
DRAPE U-SHAPE 47X51 STRL (DRAPES) IMPLANT
DRAPE U-SHAPE 76X120 STRL (DRAPES) IMPLANT
DRSG ADAPTIC 3X8 NADH LF (GAUZE/BANDAGES/DRESSINGS) ×4 IMPLANT
DRSG EMULSION OIL 3X3 NADH (GAUZE/BANDAGES/DRESSINGS) IMPLANT
ELECT REM PT RETURN 9FT ADLT (ELECTROSURGICAL) ×4
ELECTRODE REM PT RTRN 9FT ADLT (ELECTROSURGICAL) ×2 IMPLANT
GAUZE SPONGE 4X4 12PLY STRL (GAUZE/BANDAGES/DRESSINGS) ×4 IMPLANT
GLOVE BIO SURGEON STRL SZ7.5 (GLOVE) ×4 IMPLANT
GLOVE BIOGEL PI IND STRL 7.0 (GLOVE) ×4 IMPLANT
GLOVE BIOGEL PI IND STRL 8 (GLOVE) ×2 IMPLANT
GLOVE BIOGEL PI INDICATOR 7.0 (GLOVE) ×4
GLOVE BIOGEL PI INDICATOR 8 (GLOVE) ×2
GLOVE ECLIPSE 6.5 STRL STRAW (GLOVE) ×4 IMPLANT
GLOVE SURG SS PI 6.5 STRL IVOR (GLOVE) ×4 IMPLANT
GOWN STRL REUS W/TWL XL LVL3 (GOWN DISPOSABLE) ×4 IMPLANT
IMPL HEAD (Orthopedic Implant) ×2 IMPLANT
IMPL STEM WITH SCREW 8X29 (Stem) ×2 IMPLANT
IMPLANT HEAD (Orthopedic Implant) ×4 IMPLANT
IMPLANT STEM WITH SCREW 8X29 (Stem) ×4 IMPLANT
K-WIRE ACE 1.6X6 (WIRE) ×16
K-WIRE HCS 0.9X70 (WIRE) ×8
KWIRE ACE 1.6X6 (WIRE) ×8 IMPLANT
KWIRE HCS 0.9X70 (WIRE) ×4 IMPLANT
NEEDLE HYPO 25X1 1.5 SAFETY (NEEDLE) IMPLANT
NS IRRIG 1000ML POUR BTL (IV SOLUTION) ×4 IMPLANT
PACK BASIN DAY SURGERY FS (CUSTOM PROCEDURE TRAY) ×4 IMPLANT
PADDING CAST ABS 4INX4YD NS (CAST SUPPLIES) ×6
PADDING CAST ABS COTTON 4X4 ST (CAST SUPPLIES) ×6 IMPLANT
PENCIL BUTTON HOLSTER BLD 10FT (ELECTRODE) ×4 IMPLANT
PLATE OLECRANON LRG (Plate) ×4 IMPLANT
RUBBERBAND STERILE (MISCELLANEOUS) IMPLANT
SCREW CORT T15 24X3.5XST LCK (Screw) ×2 IMPLANT
SCREW CORTICAL 3.5X24MM (Screw) ×2 IMPLANT
SCREW CORTICAL LOW PROF 3.5X20 (Screw) ×8 IMPLANT
SCREW HCS COMPR 2.5X14MM (Screw) ×4 IMPLANT
SCREW LOCK CORT STAR 3.5X16 (Screw) ×8 IMPLANT
SCREW LOCK CORT STAR 3.5X18 (Screw) ×4 IMPLANT
SCREW LP 3.5 (Screw) ×4 IMPLANT
SLEEVE SCD COMPRESS KNEE MED (MISCELLANEOUS) ×4 IMPLANT
SLING ARM IMMOBILIZER LRG (SOFTGOODS) IMPLANT
SPLINT FIBERGLASS 3X35 (CAST SUPPLIES) ×4 IMPLANT
SPONGE LAP 18X18 X RAY DECT (DISPOSABLE) ×8 IMPLANT
STAPLER VISISTAT 35W (STAPLE) IMPLANT
STOCKINETTE 6  STRL (DRAPES) ×2
STOCKINETTE 6 STRL (DRAPES) ×2 IMPLANT
SUCTION FRAZIER HANDLE 10FR (MISCELLANEOUS) ×2
SUCTION TUBE FRAZIER 10FR DISP (MISCELLANEOUS) ×2 IMPLANT
SUT VIC AB 0 CT1 27 (SUTURE) ×4
SUT VIC AB 0 CT1 27XBRD ANBCTR (SUTURE) ×4 IMPLANT
SUT VIC AB 2-0 PS2 27 (SUTURE) ×4 IMPLANT
SUT VICRYL 4-0 PS2 18IN ABS (SUTURE) IMPLANT
SUT VICRYL RAPIDE 4-0 (SUTURE) IMPLANT
SUT VICRYL RAPIDE 4/0 PS 2 (SUTURE) IMPLANT
SYR BULB 3OZ (MISCELLANEOUS) ×4 IMPLANT
SYRINGE 10CC LL (SYRINGE) IMPLANT
TOWEL OR 17X24 6PK STRL BLUE (TOWEL DISPOSABLE) ×8 IMPLANT
TOWEL OR NON WOVEN STRL DISP B (DISPOSABLE) ×4 IMPLANT
TUBE CONNECTING 20'X1/4 (TUBING) ×1
TUBE CONNECTING 20X1/4 (TUBING) ×3 IMPLANT
UNDERPAD 30X30 (UNDERPADS AND DIAPERS) IMPLANT
WASHER 3.5MM (Orthopedic Implant) ×8 IMPLANT
YANKAUER SUCT BULB TIP NO VENT (SUCTIONS) ×4 IMPLANT

## 2016-04-16 NOTE — Anesthesia Preprocedure Evaluation (Addendum)
Anesthesia Evaluation  Patient identified by MRN, date of birth, ID band Patient awake    Reviewed: Allergy & Precautions, NPO status , Patient's Chart, lab work & pertinent test results  History of Anesthesia Complications (+) history of anesthetic complications (hypoxia in PACU)  Airway Mallampati: II  TM Distance: >3 FB Neck ROM: Full    Dental  (+) Teeth Intact, Dental Advisory Given   Pulmonary sleep apnea and Continuous Positive Airway Pressure Ventilation , former smoker,    Pulmonary exam normal breath sounds clear to auscultation       Cardiovascular hypertension, Normal cardiovascular exam Rhythm:Regular Rate:Normal     Neuro/Psych negative neurological ROS  negative psych ROS   GI/Hepatic negative GI ROS, Neg liver ROS,   Endo/Other  Obesity   Renal/GU negative Renal ROS     Musculoskeletal LEFT OLECRANON FRACTURE AND LEFT DISTAL RADIUS FRACTURE   Abdominal   Peds  Hematology negative hematology ROS (+)   Anesthesia Other Findings Day of surgery medications reviewed with the patient.  Reproductive/Obstetrics                            Anesthesia Physical Anesthesia Plan  ASA: II  Anesthesia Plan: General and Regional   Post-op Pain Management:  Regional for Post-op pain   Induction: Intravenous  Airway Management Planned: LMA  Additional Equipment:   Intra-op Plan:   Post-operative Plan: Extubation in OR  Informed Consent: I have reviewed the patients History and Physical, chart, labs and discussed the procedure including the risks, benefits and alternatives for the proposed anesthesia with the patient or authorized representative who has indicated his/her understanding and acceptance.   Dental advisory given  Plan Discussed with: CRNA  Anesthesia Plan Comments: (Risks/benefits of general anesthesia discussed with patient including risk of damage to teeth, lips,  gum, and tongue, nausea/vomiting, allergic reactions to medications, and the possibility of heart attack, stroke and death.  All patient questions answered.  Patient wishes to proceed.  Discussed risks and benefits of supraclavicular nerve block including failure, bleeding, infection, nerve damage, weakness, shortness of breath, pneumothorax. Questions answered. Patient consents to block. )       Anesthesia Quick Evaluation

## 2016-04-16 NOTE — Discharge Instructions (Addendum)
Discharge Instructions ° ° °You have a dressing with a plaster splint incorporated in it. °Move your fingers as much as possible, making a full fist and fully opening the fist. °Elevate your hand to reduce pain & swelling of the digits.  Ice over the operative site may be helpful to reduce pain & swelling.  DO NOT USE HEAT. °Pain medicine has been prescribed for you.  °Use your medicine as needed over the first 48 hours, and then you can begin to taper your use.  You may use Tylenol in place of your prescribed pain medication, but not IN ADDITION to it. °Leave the dressing in place until you return to our office.  °You may shower, but keep the bandage clean & dry.  °You may drive a car when you are off of prescription pain medications and can safely control your vehicle with both hands. °Our office will call you to arrange follow-up ° ° °Please call [phone] during normal business hours or [phone] after hours for any problems. Including the following: ° °- excessive redness of the incisions °- drainage for more than 4 days °- fever of more than 101.5 F ° °*Please note that pain medications will not be refilled after hours or on weekends. ° ° °Post Anesthesia Home Care Instructions ° °Activity: °Get plenty of rest for the remainder of the day. A responsible adult should stay with you for 24 hours following the procedure.  °For the next 24 hours, DO NOT: °-Drive a car °-Operate machinery °-Drink alcoholic beverages °-Take any medication unless instructed by your physician °-Make any legal decisions or sign important papers. ° °Meals: °Start with liquid foods such as gelatin or soup. Progress to regular foods as tolerated. Avoid greasy, spicy, heavy foods. If nausea and/or vomiting occur, drink only clear liquids until the nausea and/or vomiting subsides. Call your physician if vomiting continues. ° °Special Instructions/Symptoms: °Your throat may feel dry or sore from the anesthesia or the breathing tube  placed in your throat during surgery. If this causes discomfort, gargle with warm salt water. The discomfort should disappear within 24 hours. ° °If you had a scopolamine patch placed behind your ear for the management of post- operative nausea and/or vomiting: ° °1. The medication in the patch is effective for 72 hours, after which it should be removed.  Wrap patch in a tissue and discard in the trash. Wash hands thoroughly with soap and water. °2. You may remove the patch earlier than 72 hours if you experience unpleasant side effects which may include dry mouth, dizziness or visual disturbances. °3. Avoid touching the patch. Wash your hands with soap and water after contact with the patch. °  °Regional Anesthesia Blocks ° °1. Numbness or the inability to move the "blocked" extremity may last from 3-48 hours after placement. The length of time depends on the medication injected and your individual response to the medication. If the numbness is not going away after 48 hours, call your surgeon. ° °2. The extremity that is blocked will need to be protected until the numbness is gone and the  Strength has returned. Because you cannot feel it, you will need to take extra care to avoid injury. Because it may be weak, you may have difficulty moving it or using it. You may not know what position it is in without looking at it while the block is in effect. ° °3. For blocks in the legs and feet, returning to weight bearing and walking needs to be   done carefully. You will need to wait until the numbness is entirely gone and the strength has returned. You should be able to move your leg and foot normally before you try and bear weight or walk. You will need someone to be with you when you first try to ensure you do not fall and possibly risk injury. ° °4. Bruising and tenderness at the needle site are common side effects and will resolve in a few days. ° °5. Persistent numbness or new problems with movement should be  communicated to the surgeon or the [facility] ([phone])/ [facility] ([phone]). ° °

## 2016-04-16 NOTE — Progress Notes (Signed)
Assisted Dr. Turk with left, ultrasound guided, interscalene  block. Side rails up, monitors on throughout procedure. See vital signs in flow sheet. Tolerated Procedure well. 

## 2016-04-16 NOTE — Op Note (Signed)
04/16/2016  12:37 PM  PATIENT:  Norman Hansen  40 y.o. male  PRE-OPERATIVE DIAGNOSIS:  Displaced left proximal ulna fracture to include the olecranon and coronoid, displaced left radial head fracture, and relatively nondisplaced left distal radius fracture  POST-OPERATIVE DIAGNOSIS:  Same, plus lateral ulnar collateral ligament tear  PROCEDURE:   1.  ORIF left olecranon fracture    2.  ORIF left coronoid fracture    3.  Left radial head arthroplasty with prosthetic implant    4.  Left elbow lateral ulnar collateral ligament repair    5.  CRPP left distal radius fracture  SURGEON: Cliffton Asters. Janee Morn, MD  PHYSICIAN ASSISTANT: None  ANESTHESIA:  regional and general  SPECIMENS:  None  DRAINS:   None  EBL:  less than 100 mL  PREOPERATIVE INDICATIONS:  Norman Hansen is a  40 y.o. male with a complex elbow injury, including a comminuted coronoid fracture, comminuted radial head fracture, 2 part olecranon fracture, and lateral elbow ligament injury, in addition to a intra-articular minimally displaced radius fracture  The risks benefits and alternatives were discussed with the patient preoperatively including but not limited to the risks of infection, bleeding, nerve injury, cardiopulmonary complications, the need for revision surgery, among others, and the patient verbalized understanding and consented to proceed.  OPERATIVE IMPLANTS: [employer]omet prosthetic radial head implant (exPLOR, 10x22 with 8 stem), ALPS olecranon plate, and multiple buried K wires  OPERATIVE PROCEDURE:  After receiving prophylactic antibiotics and a regional block, the patient was escorted to the operative theatre and placed in a supine position.  General anesthesia was administered.  A surgical "time-out" was performed during which the planned procedure, proposed operative site, and the correct patient identity were compared to the operative consent and agreement confirmed by the circulating nurse according to  current facility policy.  Following application of a tourniquet to the operative extremity, the exposed skin was pre-scrub with a Hibiclens scrub brush before being formally prepped with Chloraprep and draped in the usual sterile fashion.  The limb was exsanguinated with an Esmarch bandage and the tourniquet inflated to approximately higher than systolic BP.  A posterior incision was made sharply with a scalpel, full-thickness flaps elevated.  The olecranon fracture was encountered first with wide displacement.  Clot and debris were removed.  Working through the fracture as if it were an olecranon osteotomy, the joint was opened.  There were multiple small chondral fragments and some osteochondral fragments that were loose.  These were all excised piecemeal.  There was resultant chondral injury and scuffing with partial articular surface loss throughout the trochlea, as well as the medial articular facet.  Ultimately, the radial head was broken into at least 3 pieces, and deemed not reconstructable.  Lateral ligament complexes been avulsed from the distal humerus.  Attention was directed to radial head implant arthroplasty with neck cut was made with an oscillating saw working through a lateral window where the ligaments of been avulsed.  The neck was planed and the canal broached up to a size 8 stem.  The appropriate implant was trialed and found to be acceptable.  It was not yet implanted.  Attention was then directed to the coronoid where it was determined that the coronoid plate was insufficient to provide adequate fixation.  The coronoid had a sagittal split within it, with one smaller lateral piece and one large medial piece.  These were secured to one another with a single K wire fixation, and then the coronoid  was reduced to the remainder of the distal ulnar major fragment and secured with a couple of crossing K wires that were also cut and buried.  With the remainder of the ulna having been thus  reconstructed, the radial head implant was placed, then the olecranon addressed.  It was provisionally reduced after copiously irrigating it and secured with crossed K wires before the posteriorly contoured olecranon plate was applied.  Combination of locking and nonlocking screws were placed in the reduction deemed to be near-anatomic.  It was well approximated to the posterior cortical surface.  The crossed K wires were removed and the process.  Full supination and pronation were possible without clicking or catching.  The lateral ligament complex was repaired with a 3 mm titanium corkscrew anchor by [employer].  The wound was again irrigated.  The tourniquet had been deflated prior to plating of the olecranon as a two-hour mark and been reached.  Additional hemostasis was obtained with Bovie electrocautery.  The fascia splits were repaired loosely with 0 Vicryl suture which was also used to reapproximate the deep dermis followed by staples in the skin.  Attention was then shifted to the distal radius where the fracture was reduced with thumb pressure and ulnar deviation of the wrist and secured with a couple of 0.062 inch K wires, one obliquely a Kapandji style and the other transversely below the articular surface, not extending far enough to be into the distal radial ulnar joint.  These K wires were bent over at the skin and clipped.  Final images were obtained for both the wrist and the elbow and a long-arm splint dressing was applied with a fiberglass component, placing the forearm in neutral rotation the elbow at 90.  He was taken to the recovery room stable condition, breathing spontaneously  DISPOSITION: He'll be discharged home today with typical instructions, returning in roughly a week for wound check, with new x-rays of the left elbow and left distal radius out of the splint and placement of a new posterior splint that can be taken on and off, covered by an Ace wrap to allow for some initiation of  motion.

## 2016-04-16 NOTE — Interval H&P Note (Signed)
History and Physical Interval Note:  04/16/2016 12:37 PM  Norman Hansen  has presented today for surgery, with the diagnosis of LEFT OLECRANON FRACTURE AND LEFT DISTAL RADIUS FRACTURE S52.022A, S52.572A  The various methods of treatment have been discussed with the patient and family. After consideration of risks, benefits and other options for treatment, the patient has consented to  Procedure(s): OPEN TREATMENT OF LEFT OLECRANON FRACTURE AND PINNING OF LEFT DISTAL RADIUS FRACTURE (Left) as a surgical intervention .  The patient's history has been reviewed, patient examined, no change in status, stable for surgery.  I have reviewed the patient's chart and labs.  Questions were answered to the patient's satisfaction.     Jaspreet Bodner A.

## 2016-04-16 NOTE — Anesthesia Procedure Notes (Addendum)
Anesthesia Regional Block:  Supraclavicular block  Pre-Anesthetic Checklist: ,, timeout performed, Correct Patient, Correct Site, Correct Laterality, Correct Procedure, Correct Position, site marked, Risks and benefits discussed,  Surgical consent,  Pre-op evaluation,  At surgeon's request and post-op pain management  Laterality: Left  Prep: chloraprep       Needles:  Injection technique: Single-shot  Needle Type: Echogenic Stimulator Needle     Needle Length: 9cm 9 cm Needle Gauge: 22 and 22 G    Additional Needles:  Procedures: ultrasound guided (picture in chart) Supraclavicular block Narrative:  Injection made incrementally with aspirations every 5 mL.  Performed by: Personally  Anesthesiologist: Cecile HearingURK, STEPHEN EDWARD  Additional Notes: Functioning IV was confirmed and monitors were applied.  A 90mm 22ga Arrow echogenic stimulator needle was used. Sterile prep and drape,hand hygiene and sterile gloves were used.  Negative aspiration and negative test dose prior to incremental administration of local anesthetic. The patient tolerated the procedure well.  Ultrasound guidance: relevent anatomy identified, needle position confirmed, local anesthetic spread visualized around nerve(s), vascular puncture avoided.  Image printed for medical record.    Procedure Name: LMA Insertion Date/Time: 04/16/2016 1:37 PM Performed by: Zenia ResidesPAYNE, Genevra Orne D Pre-anesthesia Checklist: Patient identified, Emergency Drugs available, Suction available and Patient being monitored Patient Re-evaluated:Patient Re-evaluated prior to inductionOxygen Delivery Method: Circle system utilized Preoxygenation: Pre-oxygenation with 100% oxygen Intubation Type: IV induction Ventilation: Mask ventilation without difficulty LMA: LMA inserted LMA Size: 5.0 Number of attempts: 1 Airway Equipment and Method: Bite block Placement Confirmation: positive ETCO2 Tube secured with: Tape Dental Injury: Teeth and Oropharynx  as per pre-operative assessment

## 2016-04-16 NOTE — Transfer of Care (Signed)
Immediate Anesthesia Transfer of Care Note  Patient: Norman BackerMatthew A Alcott  Procedure(s) Performed: Procedure(s) with comments: OPEN TREATMENT OF LEFT OLECRANON FRACTURE WITH TOTAL RADIAL HEAD ARTHROPLASTY AND PINNING OF LEFT DISTAL RADIUS FRACTURE (Left) - site radius  Patient Location: PACU  Anesthesia Type:General and GA combined with regional for post-op pain  Level of Consciousness: awake, alert  and oriented  Airway & Oxygen Therapy: Patient Spontanous Breathing and Patient connected to face mask oxygen  Post-op Assessment: Report given to RN and Post -op Vital signs reviewed and stable  Post vital signs: Reviewed and stable  Last Vitals:  Filed Vitals:   04/16/16 1230 04/16/16 1235  BP: 130/77   Pulse: 81 101  Temp:    Resp: 16 17    Last Pain:  Filed Vitals:   04/16/16 1235  PainSc: 3       Patients Stated Pain Goal: 2 (04/12/16 1238)  Complications: No apparent anesthesia complications

## 2016-04-18 ENCOUNTER — Encounter (HOSPITAL_BASED_OUTPATIENT_CLINIC_OR_DEPARTMENT_OTHER): Payer: Self-pay | Admitting: Orthopedic Surgery

## 2016-04-19 NOTE — Anesthesia Postprocedure Evaluation (Signed)
Anesthesia Post Note  Patient: Norman Hansen  Procedure(s) Performed: Procedure(s) (LRB): OPEN TREATMENT OF LEFT OLECRANON FRACTURE WITH TOTAL RADIAL HEAD ARTHROPLASTY OPEN REDUCTION INTERNAL FIXATION LEFT CORONOID LATERAL COLLATERAL LIGAMENT REPAIR AND PINNING OF LEFT DISTAL RADIUS FRACTURE (Left)  Patient location during evaluation: PACU Anesthesia Type: General and Regional Level of consciousness: awake and alert Pain management: pain level controlled Vital Signs Assessment: post-procedure vital signs reviewed and stable Respiratory status: spontaneous breathing, nonlabored ventilation, respiratory function stable and patient connected to nasal cannula oxygen Cardiovascular status: blood pressure returned to baseline and stable Postop Assessment: no signs of nausea or vomiting Anesthetic complications: no    Last Vitals:  Filed Vitals:   04/16/16 1815 04/16/16 1840  BP: 127/86 145/92  Pulse: 106 108  Temp:  37 C  Resp: 16 14    Last Pain:  Filed Vitals:   04/17/16 1116  PainSc: 1                  Cecile HearingStephen Edward Maddelynn Moosman

## 2017-10-01 IMAGING — DX DG ELBOW COMPLETE 3+V*L*
4 series · 4 of 4 positions shown · non-contrast
Comparison: None.

CLINICAL DATA: Fall from a TD, swelling and bruising.

EXAM:
LEFT ELBOW - COMPLETE 3+ VIEW

[x elbow lat left]
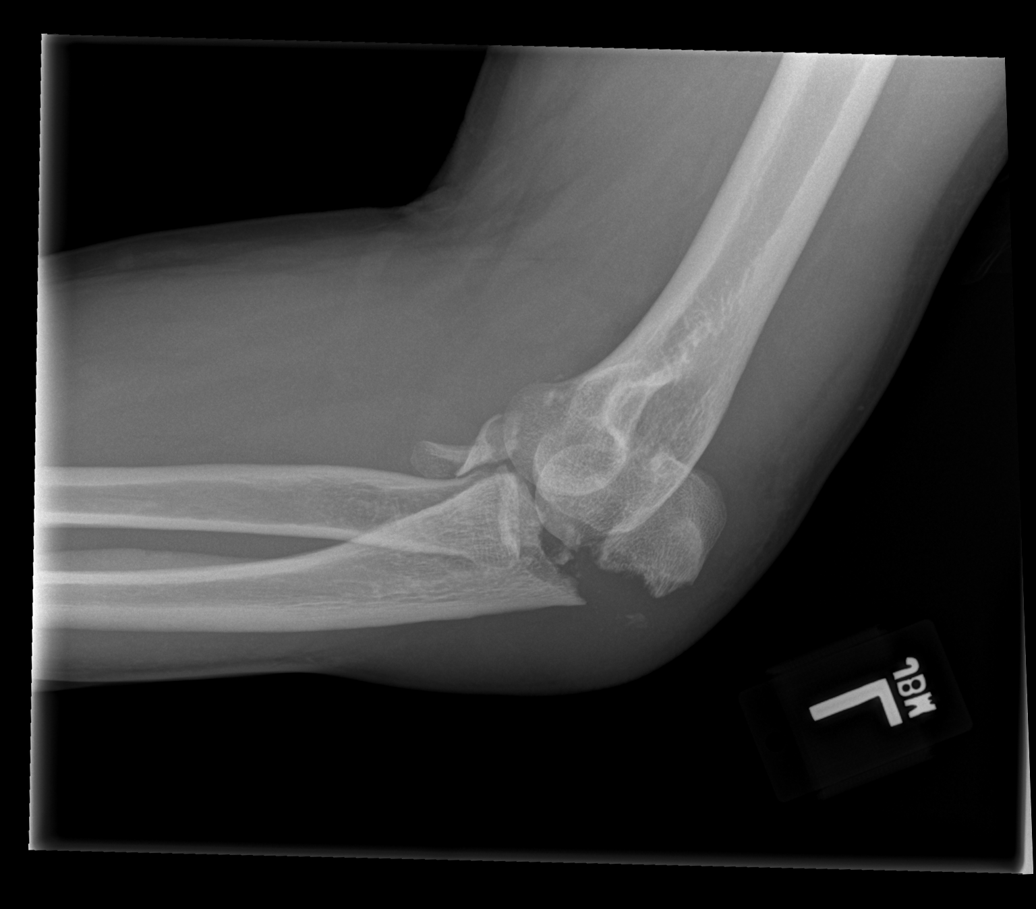

[x elbow ap left]
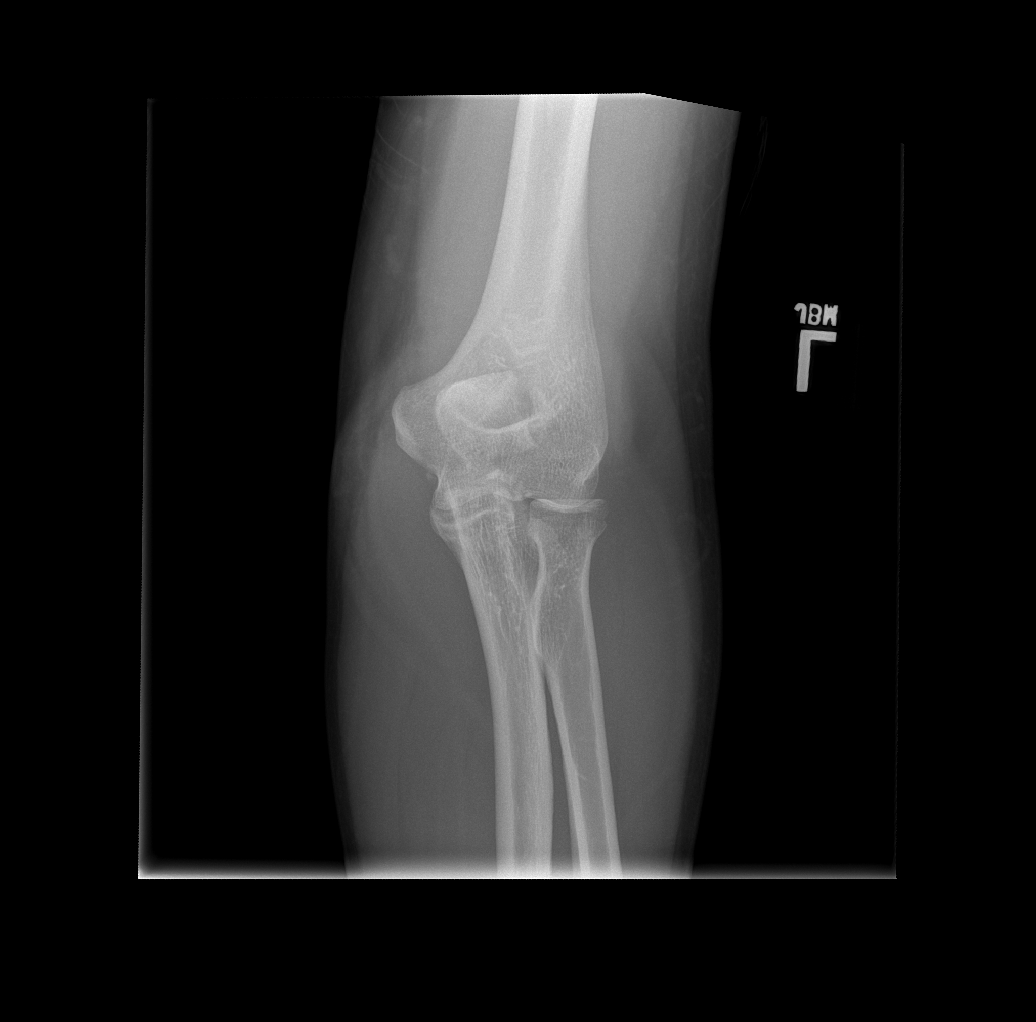

[x elbow obl left (1 of 2)]
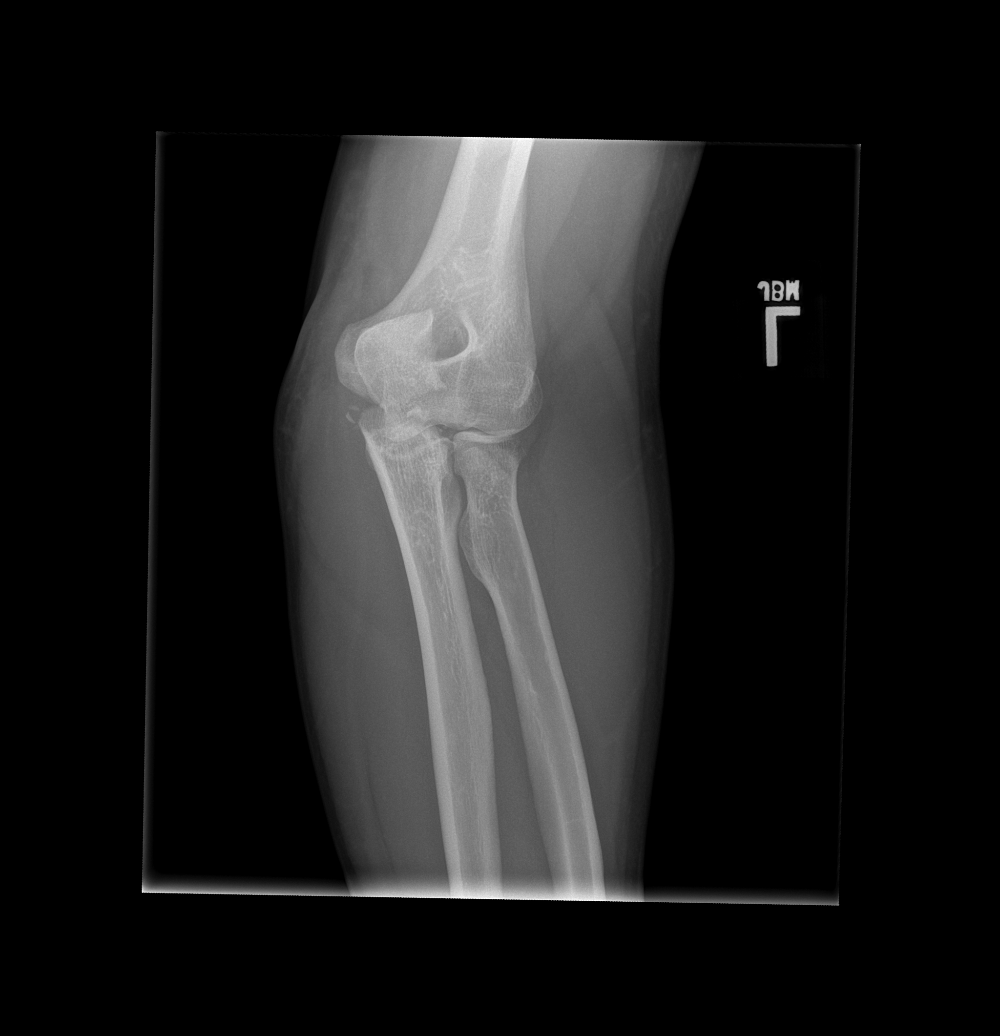

[x elbow obl left (2 of 2)]
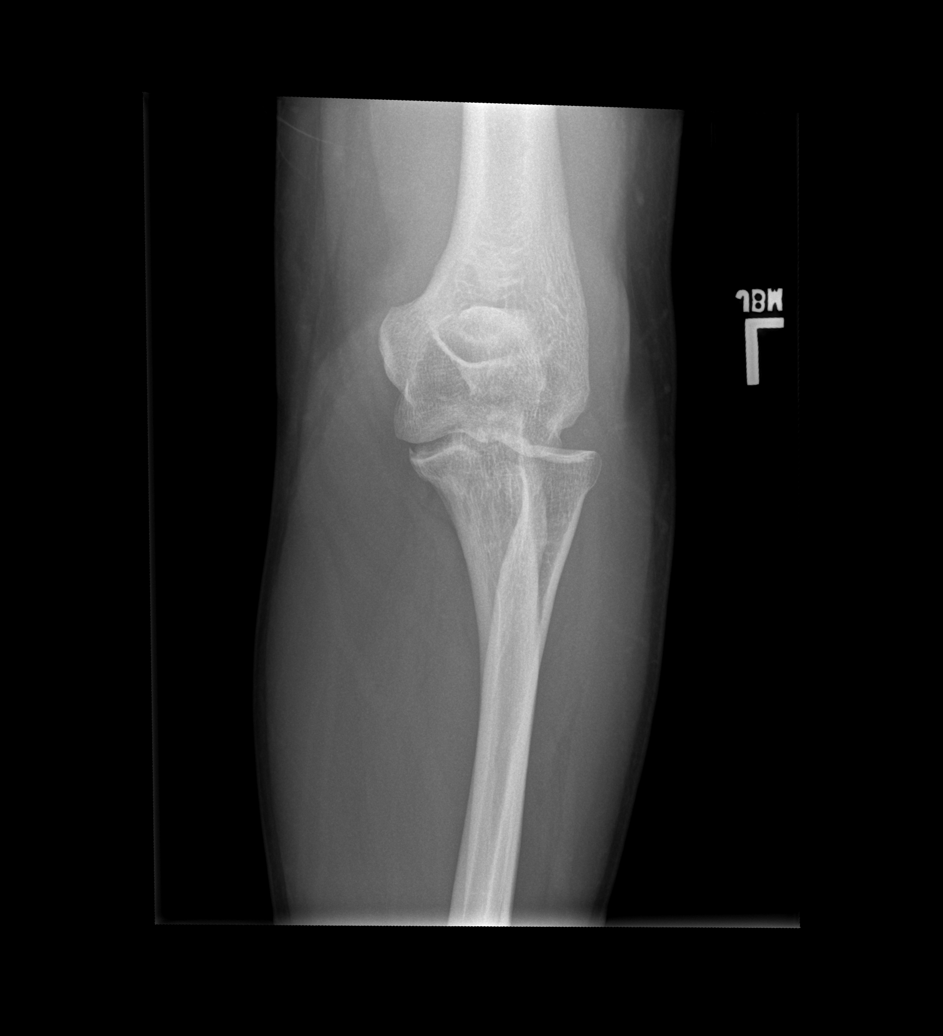

[4 of 4 positions shown; findings below may reference images not displayed]

FINDINGS: Markedly displaced fracture at the base of the olecranon, with
approximately 1.2 cm diastases between the main fracture fragments.

Suspect additional minimally displaced fracture at the lateral
margin of the radial head. On the lateral projection, there is a
displaced fracture fragment anteriorly, measuring 2.2 cm in length,
presumably originating from the olecranon.
IMPRESSION: 1. Prominently displaced fracture at the base of the olecranon.
cm fracture fragment along the anterior margin of the joint space is
of uncertain origination but favored to be from the olecranon.
2. Probable additional minimally displaced fracture of the radial
head.

## 2023-02-24 ENCOUNTER — Ambulatory Visit
Admission: RE | Admit: 2023-02-24 | Discharge: 2023-02-24 | Disposition: A | Discharge status: Home / Self Care | Payer: 59 | Source: Ambulatory Visit | Attending: Internal Medicine | Admitting: Internal Medicine

## 2023-02-24 ENCOUNTER — Other Ambulatory Visit: Payer: Self-pay | Admitting: Internal Medicine

## 2023-02-24 DIAGNOSIS — M25422 Effusion, left elbow: Secondary | ICD-10-CM
